# Patient Record
Sex: Female | Born: 1975 | Race: Black or African American | Hispanic: No | Marital: Married | Smoking: Never smoker
Health system: Southern US, Community
[De-identification: ages and names within clinical notes are randomized; demographics above are authoritative.]

---

## 1999-02-16 ENCOUNTER — Other Ambulatory Visit: Admission: RE | Admit: 1999-02-16 | Discharge: 1999-02-16 | Payer: Self-pay | Admitting: Obstetrics & Gynecology

## 2015-09-10 ENCOUNTER — Emergency Department (HOSPITAL_BASED_OUTPATIENT_CLINIC_OR_DEPARTMENT_OTHER)
Admission: EM | Admit: 2015-09-10 | Discharge: 2015-09-10 | Disposition: A | Discharge status: Home / Self Care | Payer: BLUE CROSS/BLUE SHIELD | Attending: Emergency Medicine | Admitting: Emergency Medicine

## 2015-09-10 ENCOUNTER — Emergency Department (HOSPITAL_BASED_OUTPATIENT_CLINIC_OR_DEPARTMENT_OTHER): Payer: BLUE CROSS/BLUE SHIELD

## 2015-09-10 ENCOUNTER — Encounter (HOSPITAL_BASED_OUTPATIENT_CLINIC_OR_DEPARTMENT_OTHER): Payer: Self-pay | Admitting: *Deleted

## 2015-09-10 DIAGNOSIS — Y9289 Other specified places as the place of occurrence of the external cause: Secondary | ICD-10-CM | POA: Diagnosis not present

## 2015-09-10 DIAGNOSIS — Y9351 Activity, roller skating (inline) and skateboarding: Secondary | ICD-10-CM | POA: Insufficient documentation

## 2015-09-10 DIAGNOSIS — Y998 Other external cause status: Secondary | ICD-10-CM | POA: Diagnosis not present

## 2015-09-10 DIAGNOSIS — S52135A Nondisplaced fracture of neck of left radius, initial encounter for closed fracture: Secondary | ICD-10-CM | POA: Diagnosis not present

## 2015-09-10 DIAGNOSIS — S59902A Unspecified injury of left elbow, initial encounter: Secondary | ICD-10-CM | POA: Diagnosis present

## 2015-09-10 DIAGNOSIS — S52132A Displaced fracture of neck of left radius, initial encounter for closed fracture: Secondary | ICD-10-CM

## 2015-09-10 MED ORDER — IBUPROFEN 800 MG PO TABS: 800.0000 mg | ORAL_TABLET | Freq: Three times a day (TID) | ORAL | Status: AC

## 2015-09-10 MED ORDER — HYDROCODONE-ACETAMINOPHEN 5-325 MG PO TABS
1.0000 | ORAL_TABLET | Freq: Once | ORAL | Status: AC
Start: 2015-09-10 — End: 2015-09-10
  Administered 2015-09-10: 1 via ORAL
  Filled 2015-09-10: qty 1

## 2015-09-10 NOTE — Discharge Instructions (Signed)
1. Medications: alternate naprosyn and tylenol for pain control, usual home medications 2. Treatment: rest, ice, elevate, keep cast clean and dry 3. Follow Up: Please followup with orthopedics as directed in 1 week if no improvement for discussion of your diagnoses and further evaluation after today's visit; if you do not have a primary care doctor use the resource guide provided to find one; Please return to the ER for worsening symptoms or other concerns    Cast or Splint Care Casts and splints support injured limbs and keep bones from moving while they heal. It is important to care for your cast or splint at home.  HOME CARE INSTRUCTIONS  Keep the cast or splint uncovered during the drying period. It can take 24 to 48 hours to dry if it is made of plaster. A fiberglass cast will dry in less than 1 hour.  Do not rest the cast on anything harder than a pillow for the first 24 hours.  Do not put weight on your injured limb or apply pressure to the cast until your health care provider gives you permission.  Keep the cast or splint dry. Wet casts or splints can lose their shape and may not support the limb as well. A wet cast that has lost its shape can also create harmful pressure on your skin when it dries. Also, wet skin can become infected.  Cover the cast or splint with a plastic bag when bathing or when out in the rain or snow. If the cast is on the trunk of the body, take sponge baths until the cast is removed.  If your cast does become wet, dry it with a towel or a blow dryer on the cool setting only.  Keep your cast or splint clean. Soiled casts may be wiped with a moistened cloth.  Do not place any hard or soft foreign objects under your cast or splint, such as cotton, toilet paper, lotion, or powder.  Do not try to scratch the skin under the cast with any object. The object could get stuck inside the cast. Also, scratching could lead to an infection. If itching is a problem, use a  blow dryer on a cool setting to relieve discomfort.  Do not trim or cut your cast or remove padding from inside of it.  Exercise all joints next to the injury that are not immobilized by the cast or splint. For example, if you have a long leg cast, exercise the hip joint and toes. If you have an arm cast or splint, exercise the shoulder, elbow, thumb, and fingers.  Elevate your injured arm or leg on 1 or 2 pillows for the first 1 to 3 days to decrease swelling and pain.It is best if you can comfortably elevate your cast so it is higher than your heart. SEEK MEDICAL CARE IF:   Your cast or splint cracks.  Your cast or splint is too tight or too loose.  You have unbearable itching inside the cast.  Your cast becomes wet or develops a soft spot or area.  You have a bad smell coming from inside your cast.  You get an object stuck under your cast.  Your skin around the cast becomes red or raw.  You have new pain or worsening pain after the cast has been applied. SEEK IMMEDIATE MEDICAL CARE IF:   You have fluid leaking through the cast.  You are unable to move your fingers or toes.  You have discolored (blue or white),  cool, painful, or very swollen fingers or toes beyond the cast.  You have tingling or numbness around the injured area.  You have severe pain or pressure under the cast.  You have any difficulty with your breathing or have shortness of breath.  You have chest pain.   This information is not intended to replace advice given to you by your health care provider. Make sure you discuss any questions you have with your health care provider.   Document Released: 09/13/2000 Document Revised: 07/07/2013 Document Reviewed: 03/25/2013 Elsevier Interactive Patient Education [employer]

## 2015-09-10 NOTE — ED Provider Notes (Signed)
CSN: [id number]     Arrival date & time 09/10/15  2006 History   First MD Initiated Contact with Patient 09/10/15 2017     Chief Complaint  Patient presents with  . Arm Pain     (Consider location/radiation/quality/duration/timing/severity/associated sxs/prior Treatment) The history is provided by the patient and medical records. No language interpreter was used.     Jessica Morris is a 39 y.o. female  with no major medical Hx presents to the Emergency Department complaining of gradual, persistent, progressively worsening left elbow pain onset around 6pm tonight while roller skating.  She reports she fell backwards and caught herself with her hands, but reports more of her weight landed on her left arm.  No treatments prior to arrival. Patient denies numbness, weakness, tingling. She does report decreased range of motion including decreased supination and pronation due to pain.  No alleviating factors. Movement and palpation aggravate the pain.  History reviewed. No pertinent past medical history. History reviewed. No pertinent past surgical history. History reviewed. No pertinent family history. Social History  Substance Use Topics  . Smoking status: Never Smoker   . Smokeless tobacco: None  . Alcohol Use: No   OB History    No data available     Review of Systems  Constitutional: Negative for fever and chills.  Gastrointestinal: Negative for nausea and vomiting.  Musculoskeletal: Positive for joint swelling and arthralgias. Negative for back pain, neck pain and neck stiffness.  Skin: Negative for wound.  Neurological: Negative for numbness.  Hematological: Does not bruise/bleed easily.  Psychiatric/Behavioral: The patient is not nervous/anxious.   All other systems reviewed and are negative.     Allergies  Review of patient's allergies indicates no known allergies.  Home Medications   Prior to Admission medications   Medication Sig Start Date End Date Taking? Authorizing  Provider  ibuprofen (ADVIL,MOTRIN) 800 MG tablet Take 1 tablet (800 mg total) by mouth 3 (three) times daily. 09/10/15   Letica Giaimo, PA-C   BP 149/94 mmHg  Pulse 84  Temp(Src) 98.3 F (36.8 C) (Oral)  Resp 18  Ht  (1.702 m)  Wt 90.719 kg  BMI 31.32 kg/m2  SpO2 100%  LMP 09/03/2015 Physical Exam  Constitutional: She appears well-developed and well-nourished. No distress.  HENT:  Head: Normocephalic and atraumatic.  Eyes: Conjunctivae are normal.  Neck: Normal range of motion.  Cardiovascular: Normal rate, regular rhythm and intact distal pulses.   Capillary refill < 3 sec  Pulmonary/Chest: Effort normal and breath sounds normal.  Musculoskeletal: She exhibits tenderness. She exhibits no edema.  Full range of motion of the left shoulder left wrist and all fingers of the left hand Limited flexion and extension of the left elbow Tenderness to palpation along the proximal radius without palpable deformity; no tenderness to palpation of the olecranon  Neurological: She is alert. Coordination normal.  Sensation intact to dull and sharp in the left upper extremity  Strength 5/5 with flexion extension of the wrist and shoulder, her grip strength; 4/5 with flexion extension of the elbow due to pain  Skin: Skin is warm and dry. She is not diaphoretic.  No tenting of the skin  Psychiatric: She has a normal mood and affect.  Nursing note and vitals reviewed.   ED Course  Procedures (including critical care time)  Imaging Review Dg Elbow Complete Left  09/10/2015  CLINICAL DATA:  Larey Seat roller-skating with left elbow injury on limited range of motion EXAM: LEFT ELBOW - COMPLETE 3+  VIEW COMPARISON:  None. FINDINGS: THERE IS A JOINT EFFUSION AS SEEN BY ANTERIOR AND POSTERIOR ELBOW FAT PADS. THERE IS MILD ANGULATION AT THE RADIAL NECK WITH A VERY SUBTLE CORTICAL IRREGULARITY ALSO IDENTIFIED AT THE NECK OF THE RADIUS. IMPRESSION: RADIAL NECK FRACTURE, NONDISPLACED AND MILDLY  ANGULATED Electronically Signed   By: Esperanza Heiraymond  Rubner M.D.   On: 09/10/2015 20:43   I have personally reviewed and evaluated these images and lab results as part of my medical decision-making.  9:48 PM  Capillary refill, motor and sensation intact pre and post splint application.    MDM   Final diagnoses:  Radial neck fracture, left, closed, initial encounter   Jessica Morris presents with left elbow pain after fall. Patient X-Ray with angulated but nondisplaced radial neck fracture of the left arm. Pain managed in ED. Pt advised to follow up with orthopedics for further evaluation and treatment.  Pain managed in the department. Patient given long arm splint while in ED, conservative therapy recommended and discussed. Patient will be dc home & is agreeable with above plan. I have also discussed reasons to return immediately to the ER.  Patient expresses understanding and agrees with plan.  BP 149/94 mmHg  Pulse 84  Temp(Src) 98.3 F (36.8 C) (Oral)  Resp 18  Ht 5\' 7"  (1.702 m)  Wt 90.719 kg  BMI 31.32 kg/m2  SpO2 100%  LMP 09/03/2015      Dierdre ForthHannah Maciej Schweitzer, PA-C 09/10/15 [id number]  Linwood DibblesJon Knapp, MD 09/10/15 [id number]

## 2015-09-10 NOTE — ED Notes (Signed)
Pt fell while roller skating tonight.  Injured left elbow, pt can move it with pain.

## 2017-03-14 IMAGING — CR DG ELBOW COMPLETE 3+V*L*
4 series · 4 of 4 positions shown · non-contrast
Comparison: None.

CLINICAL DATA: Fell roller-skating with left elbow injury on
limited range of motion

EXAM:
LEFT ELBOW - COMPLETE 3+ VIEW

[x elbow joint ap left]
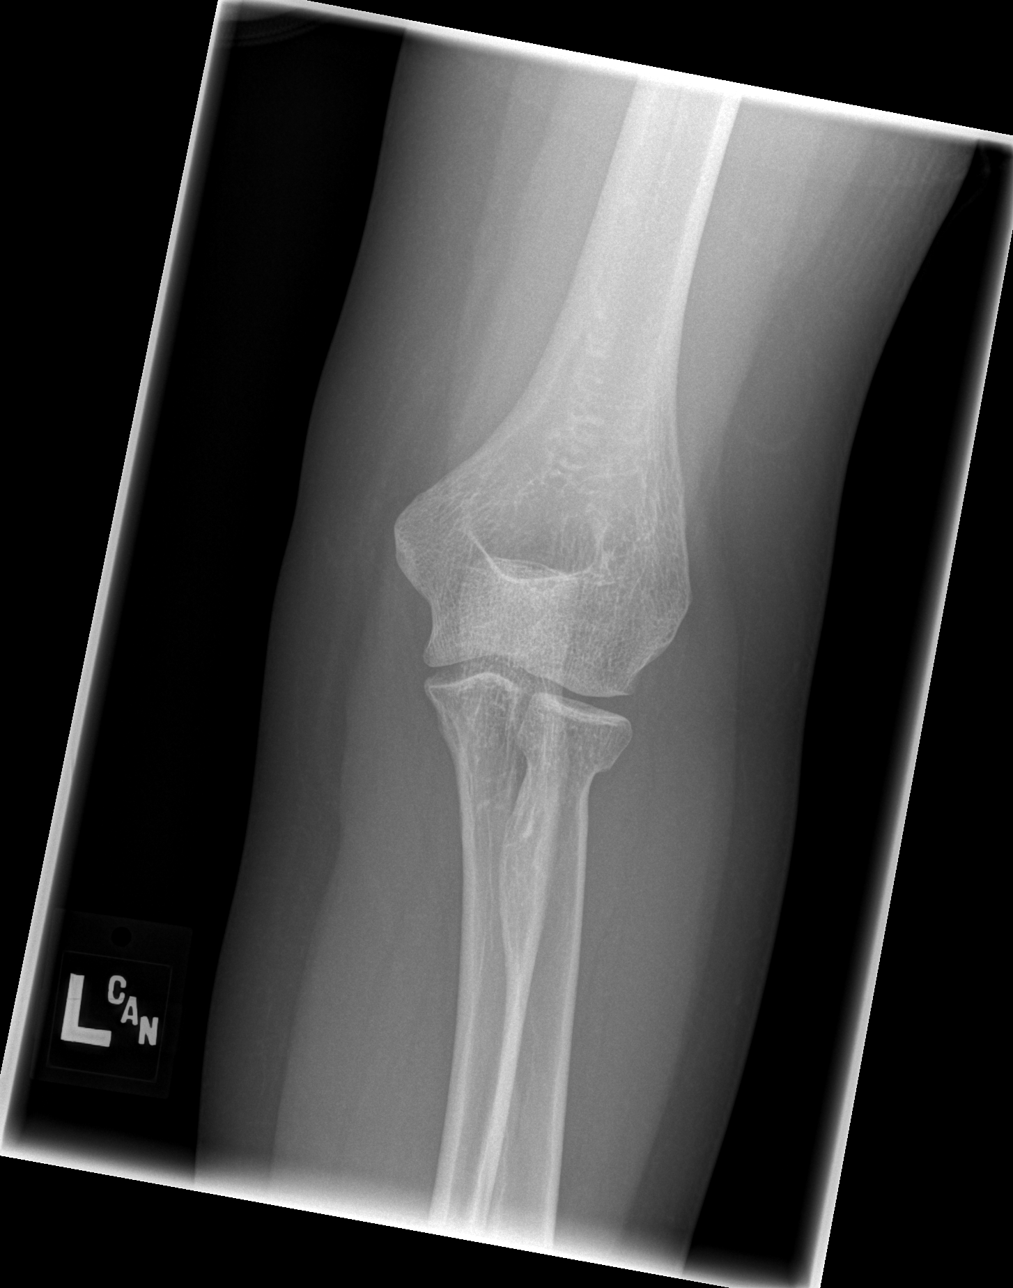

[x elbow joint obl. left (1 of 2)]
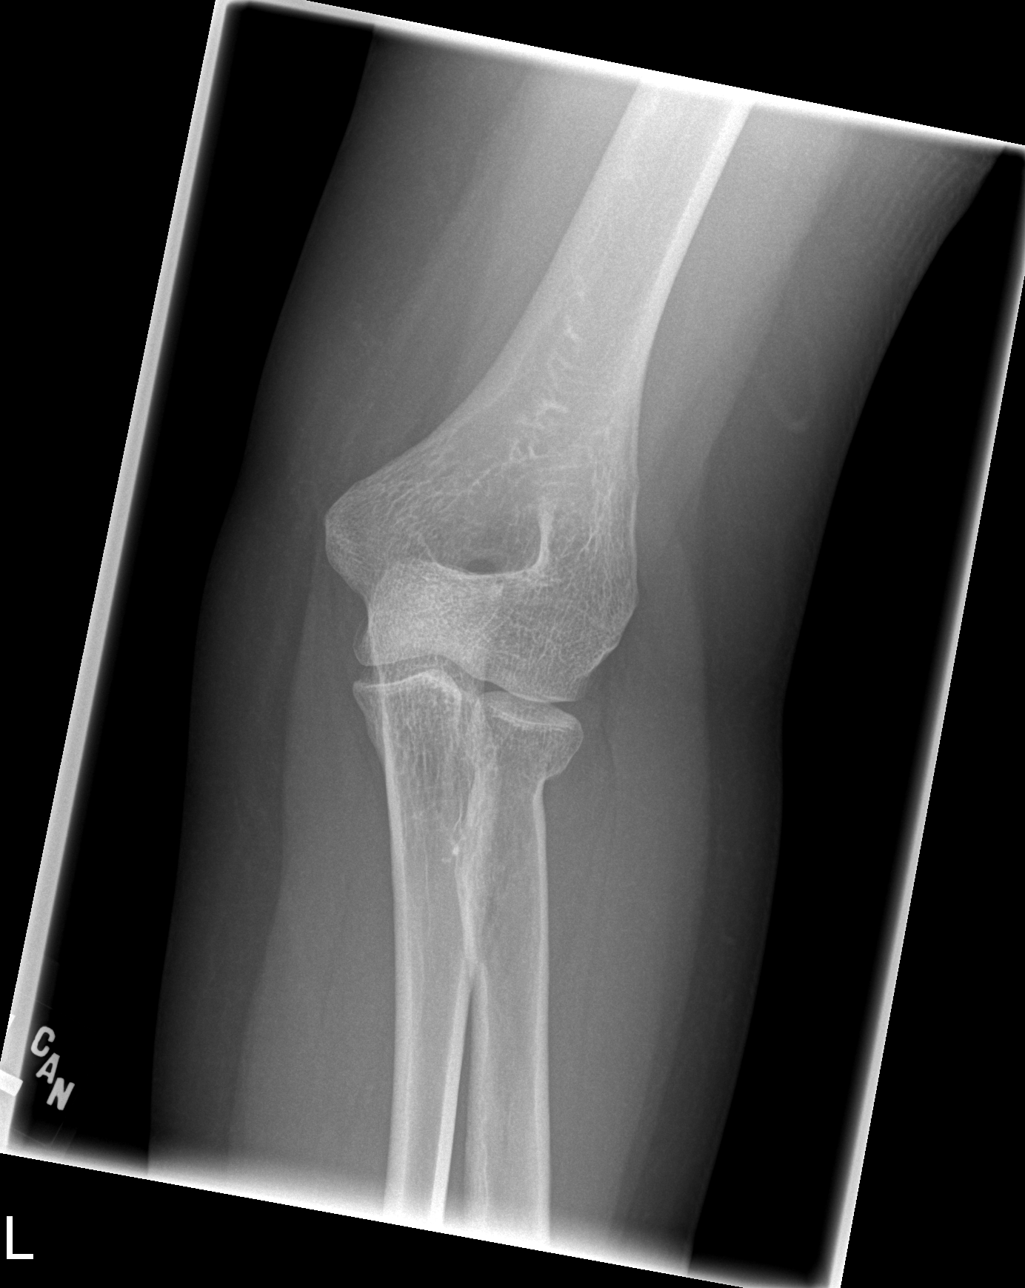

[x elbow joint obl. left (2 of 2)]
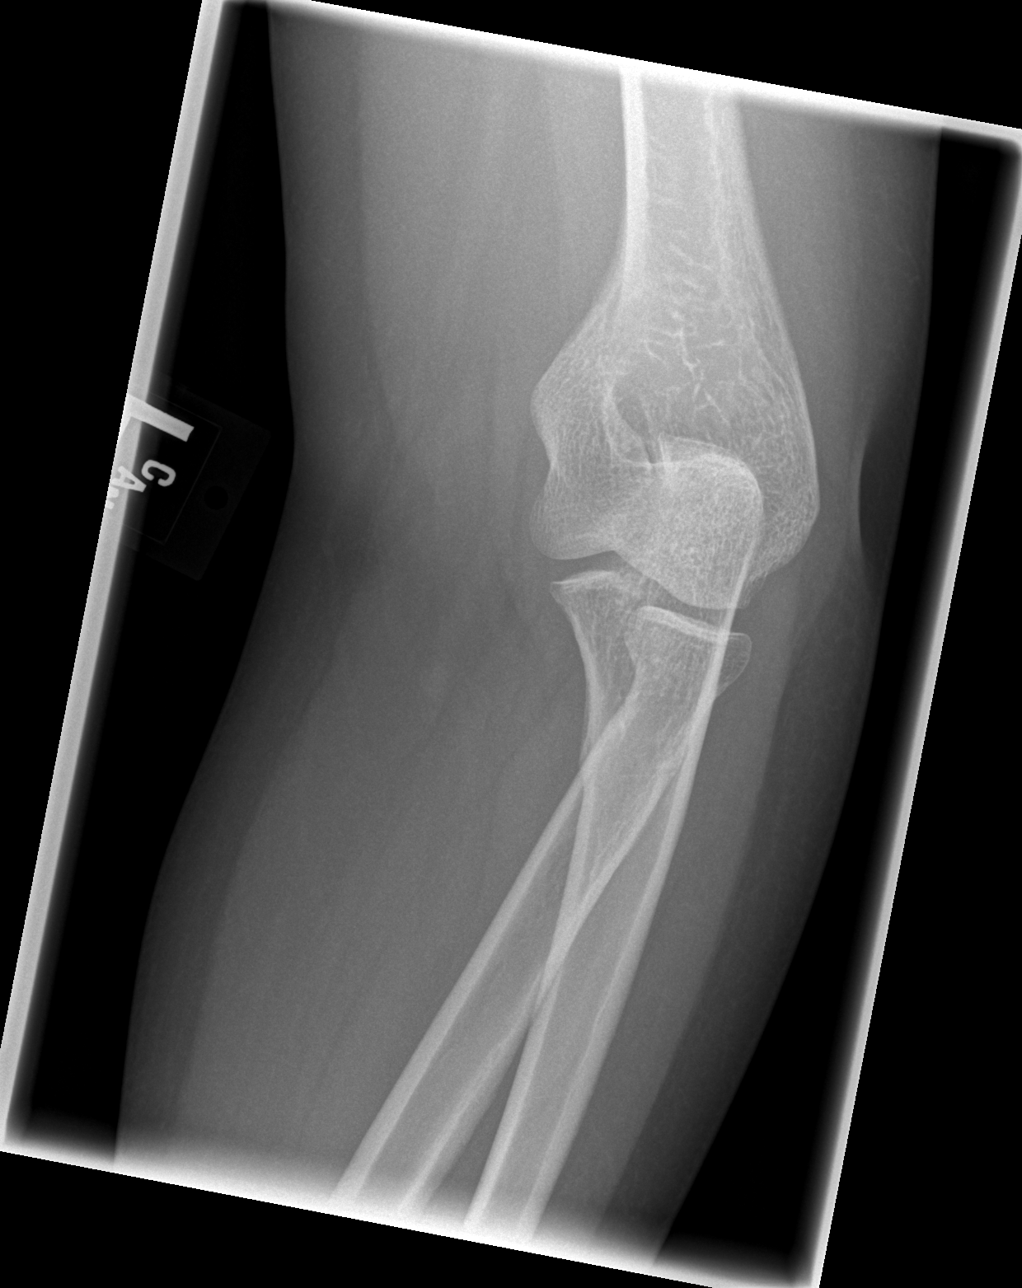

[x elbow joint lat left]
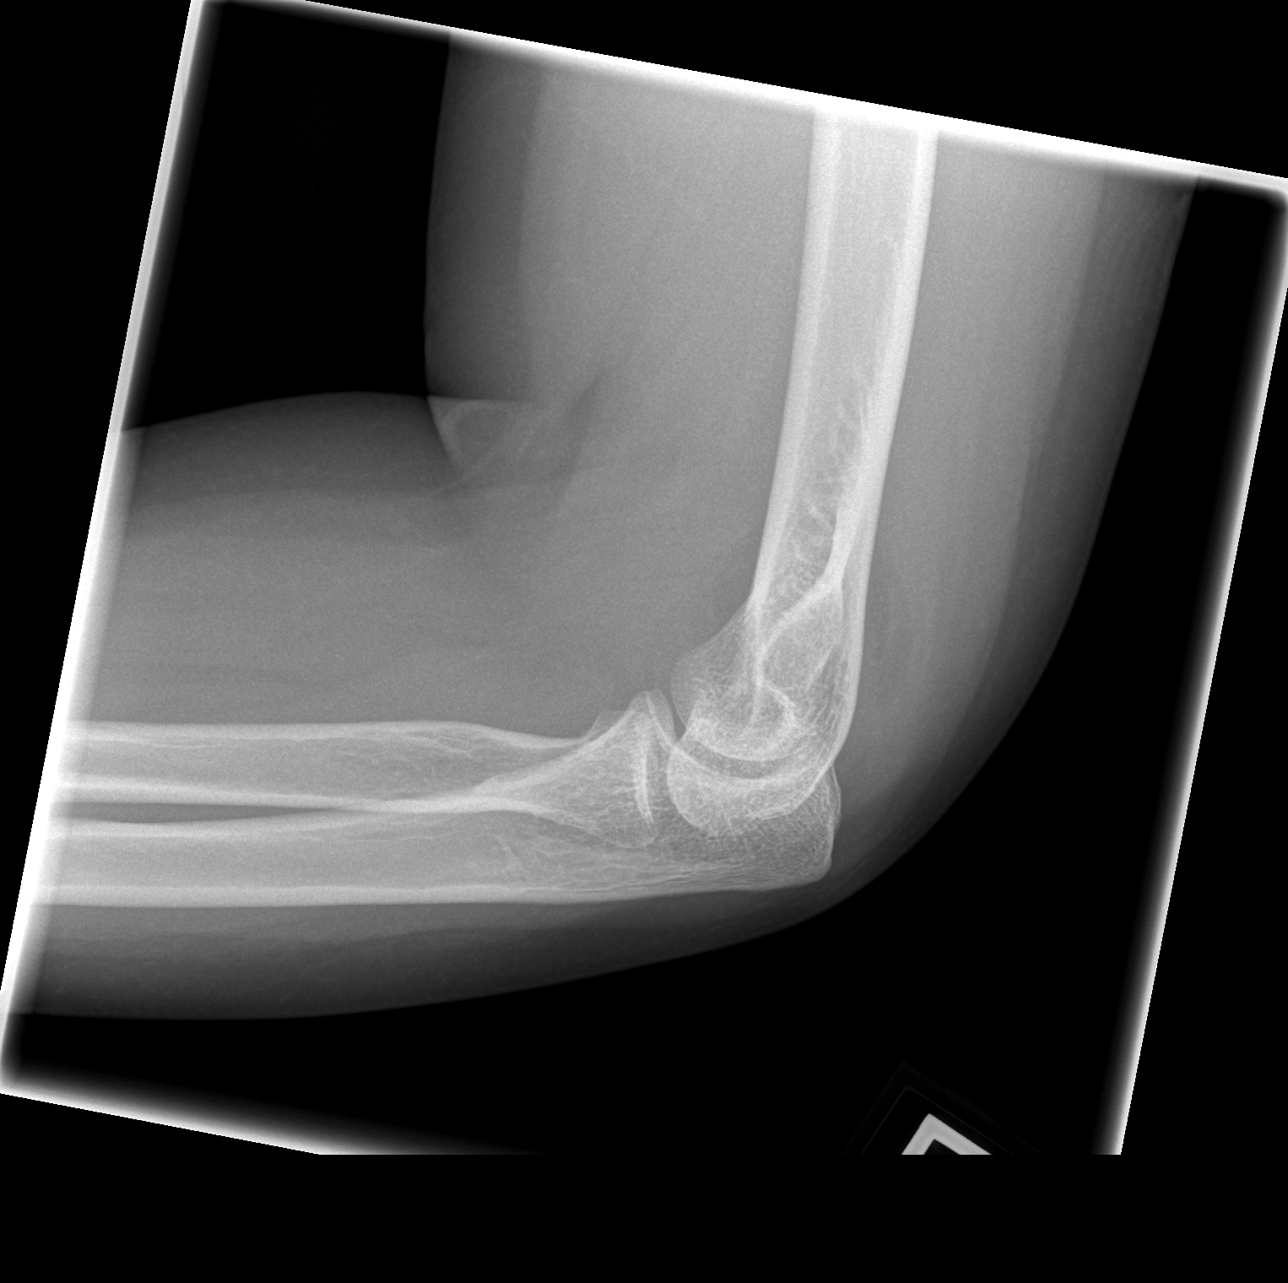

[4 of 4 positions shown; findings below may reference images not displayed]

FINDINGS: THERE IS A JOINT EFFUSION AS SEEN BY ANTERIOR AND POSTERIOR ELBOW
FAT PADS. THERE IS MILD ANGULATION AT THE RADIAL NECK WITH A VERY
SUBTLE CORTICAL IRREGULARITY ALSO IDENTIFIED AT THE NECK OF THE
RADIUS.
IMPRESSION: RADIAL NECK FRACTURE, NONDISPLACED AND MILDLY ANGULATED
